# Patient Record
Sex: Male | Born: 1990 | Race: White | Hispanic: No | Marital: Single | Smoking: Current every day smoker
Health system: Southern US, Community
[De-identification: ages and names within clinical notes are randomized; demographics above are authoritative.]

## PROBLEM LIST (undated history)

## (undated) HISTORY — PX: PILONIDAL CYST EXCISION: SHX744

---

## 2012-10-05 ENCOUNTER — Emergency Department (HOSPITAL_COMMUNITY)
Admission: EM | Admit: 2012-10-05 | Discharge: 2012-10-05 | Disposition: A | Discharge status: Home / Self Care | Payer: BC Managed Care – PPO | Attending: Emergency Medicine | Admitting: Emergency Medicine

## 2012-10-05 ENCOUNTER — Encounter (HOSPITAL_COMMUNITY): Payer: Self-pay | Admitting: Emergency Medicine

## 2012-10-05 ENCOUNTER — Emergency Department (HOSPITAL_COMMUNITY): Payer: BC Managed Care – PPO

## 2012-10-05 DIAGNOSIS — S2249XA Multiple fractures of ribs, unspecified side, initial encounter for closed fracture: Secondary | ICD-10-CM | POA: Insufficient documentation

## 2012-10-05 DIAGNOSIS — W1809XA Striking against other object with subsequent fall, initial encounter: Secondary | ICD-10-CM | POA: Insufficient documentation

## 2012-10-05 DIAGNOSIS — Y9351 Activity, roller skating (inline) and skateboarding: Secondary | ICD-10-CM | POA: Insufficient documentation

## 2012-10-05 DIAGNOSIS — Y9289 Other specified places as the place of occurrence of the external cause: Secondary | ICD-10-CM | POA: Insufficient documentation

## 2012-10-05 DIAGNOSIS — S2239XA Fracture of one rib, unspecified side, initial encounter for closed fracture: Secondary | ICD-10-CM

## 2012-10-05 DIAGNOSIS — F17200 Nicotine dependence, unspecified, uncomplicated: Secondary | ICD-10-CM | POA: Insufficient documentation

## 2012-10-05 MED ORDER — IBUPROFEN 400 MG PO TABS
800.0000 mg | ORAL_TABLET | Freq: Once | ORAL | Status: AC
  Administered 2012-10-05: 800 mg via ORAL
  Filled 2012-10-05: qty 2

## 2012-10-05 MED ORDER — HYDROCODONE-ACETAMINOPHEN 5-325 MG PO TABS: 1.0000 | ORAL_TABLET | Freq: Four times a day (QID) | ORAL | Status: DC | PRN

## 2012-10-05 MED ORDER — OXYCODONE-ACETAMINOPHEN 5-325 MG PO TABS
2.0000 | ORAL_TABLET | Freq: Once | ORAL | Status: AC
  Administered 2012-10-05: 2 via ORAL
  Filled 2012-10-05: qty 2

## 2012-10-05 MED ORDER — IBUPROFEN 800 MG PO TABS: 800.0000 mg | ORAL_TABLET | Freq: Three times a day (TID) | ORAL | Status: DC

## 2012-10-05 NOTE — ED Notes (Signed)
Pt. Stated, i fell backwards and hit a corner on my rt. Rib area and when i woke up this morning I couldn't hardly move.
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
## 2014-10-19 ENCOUNTER — Ambulatory Visit (INDEPENDENT_AMBULATORY_CARE_PROVIDER_SITE_OTHER): Payer: 59 | Admitting: Family Medicine

## 2014-10-19 VITALS — BP 118/72 | HR 77 | Temp 98.4°F | Resp 18 | Ht 68.0 in | Wt 163.8 lb

## 2014-10-19 DIAGNOSIS — R112 Nausea with vomiting, unspecified: Secondary | ICD-10-CM

## 2014-10-19 DIAGNOSIS — R197 Diarrhea, unspecified: Secondary | ICD-10-CM

## 2014-10-19 DIAGNOSIS — K5289 Other specified noninfective gastroenteritis and colitis: Secondary | ICD-10-CM

## 2014-10-19 LAB — POCT CBC
Granulocyte percent: 68.9 %G (ref 37–80)
HCT, POC: 49.4 % (ref 43.5–53.7)
Hemoglobin: 16.3 g/dL (ref 14.1–18.1)
Lymph, poc: 1.6 (ref 0.6–3.4)
MCH, POC: 30 pg (ref 27–31.2)
MCHC: 33 g/dL (ref 31.8–35.4)
MCV: 90.9 fL (ref 80–97)
MID (cbc): 0.5 (ref 0–0.9)
MPV: 8.7 fL (ref 0–99.8)
POC Granulocyte: 4.7 (ref 2–6.9)
POC LYMPH PERCENT: 24 %L (ref 10–50)
POC MID %: 7.1 %M (ref 0–12)
Platelet Count, POC: 222 10*3/uL (ref 142–424)
RBC: 5.44 M/uL (ref 4.69–6.13)
RDW, POC: 13 %
WBC: 6.8 10*3/uL (ref 4.6–10.2)

## 2014-10-19 MED ORDER — PROMETHAZINE HCL 25 MG PO TABS: 25.0000 mg | ORAL_TABLET | Freq: Three times a day (TID) | ORAL | Status: DC | PRN

## 2014-10-19 NOTE — Progress Notes (Signed)
Chief Complaint:  Chief Complaint  Patient presents with  . Diarrhea    x2 days; possible food poisoning  . Emesis  . Dizziness  . Abdominal Pain    HPI: Miguel Moody is a 23 y.o. male who is here for  Food posioning, went to [employer] on Friday and [employer] on Saturday Ate falafel at both places, they had sour cream sauce at [employer] and mediterranenan sauce at [employer] who ate with him at [employer] is fine, he did have same sauces .  5 pm on Sunday was when sxs started, no fevers or chills, he is minimally better nonbloody diarrhea, he has been laying in bed since Sunday. Has used bathroom  Has had abd pain and emesis with just stomach acid He has strong cramps and pressure.  He is not on any new meds, no abx and no new travels, no fevers or chills, no sick contacts No family hx of crohns colitis or IBD or IBS  History reviewed. No pertinent past medical history. History reviewed. No pertinent past surgical history. History   Social History  . Marital Status: Single    Spouse Name: N/A    Number of Children: N/A  . Years of Education: N/A   Social History Main Topics  . Smoking status: Current Every Day Smoker  . Smokeless tobacco: None  . Alcohol Use: Yes  . Drug Use: No  . Sexual Activity: None   Other Topics Concern  . None   Social History Narrative   History reviewed. No pertinent family history. No Known Allergies Prior to Admission medications   Not on File     ROS: The patient denies fevers, chills, night sweats, unintentional weight loss, chest pain, palpitations, wheezing, dyspnea on exertion,, dysuria, hematuria, melena, numbness, weakness, or tingling.   All other systems have been reviewed and were otherwise negative with the exception of those mentioned in the HPI and as above.    PHYSICAL EXAM: Filed Vitals:   10/19/14 1819  BP: 118/72  Pulse: 77  Temp: 98.4 F (36.9 C)  Resp: 18   Filed Vitals:   10/19/14 1819  Height: 5\' 8"  (1.727 m)  Weight: 163 lb 12.8 oz (74.299 kg)   Body mass index is 24.91 kg/(m^2).  General: Alert, no acute distress HEENT:  Normocephalic, atraumatic, oropharynx patent. EOMI, PERRLA Cardiovascular:  Regular rate and rhythm, no rubs murmurs or gallops.  No Carotid bruits, radial pulse intact. No pedal edema.  Respiratory: Clear to auscultation bilaterally.  No wheezes, rales, or rhonchi.  No cyanosis, no use of accessory musculature GI: No organomegaly, abdomen is soft and non-tender, positive bowel sounds.  No masses. Skin: No rashes. Neurologic: Facial musculature symmetric. Psychiatric: Patient is appropriate throughout our interaction. Lymphatic: No cervical lymphadenopathy Musculoskeletal: Gait intact.   LABS: Results for orders placed or performed in visit on 10/19/14  Ova and parasite examination  Result Value Ref Range   OP No Ova or Parasites Seen    COMPLETE METABOLIC PANEL WITH GFR  Result Value Ref Range   Sodium 139 135 - 145 mEq/L   Potassium 4.3 3.5 - 5.3 mEq/L   Chloride 104 96 - 112 mEq/L   CO2 27 19 - 32 mEq/L   Glucose, Bld 85 70 - 99 mg/dL   BUN 5 (L) 6 - 23 mg/dL   Creat 0.74 0.50 - 1.35 mg/dL   Total Bilirubin 0.6 0.2 - 1.2 mg/dL   Alkaline Phosphatase  52 39 - 117 U/L   AST 22 0 - 37 U/L   ALT 29 0 - 53 U/L   Total Protein 7.0 6.0 - 8.3 g/dL   Albumin 4.3 3.5 - 5.2 g/dL   Calcium 9.7 8.4 - 10.5 mg/dL   GFR, Est African American >89 mL/min   GFR, Est Non African American >89 mL/min  POCT CBC  Result Value Ref Range   WBC 6.8 4.6 - 10.2 K/uL   Lymph, poc 1.6 0.6 - 3.4   POC LYMPH PERCENT 24.0 10 - 50 %L   MID (cbc) 0.5 0 - 0.9   POC MID % 7.1 0 - 12 %M   POC Granulocyte 4.7 2 - 6.9   Granulocyte percent 68.9 37 - 80 %G   RBC 5.44 4.69 - 6.13 M/uL   Hemoglobin 16.3 14.1 - 18.1 g/dL   HCT, POC 49.4 43.5 - 53.7 %   MCV 90.9 80 - 97 fL   MCH, POC 30.0 27 - 31.2 pg   MCHC 33.0 31.8 - 35.4 g/dL   RDW, POC 13.0 %    Platelet Count, POC 222 142 - 424 K/uL   MPV 8.7 0 - 99.8 fL     EKG/XRAY:   Primary read interpreted by Dr. Marin Comment at [facility].   ASSESSMENT/PLAN: Encounter Diagnoses  Name Primary?  . Nausea and vomiting, vomiting of unspecified type Yes  . Diarrhea   . Other noninfectious gastroenteritis    Rx promethazine, BRAT, push fluids Stool cx and OP pending Will turn in stool sample when he is able to do it. Only able to give small specimen today F/u prn   Gross sideeffects, risk and benefits, and alternatives of medications d/w patient. Patient is aware that all medications have potential sideeffects and we are unable to predict every sideeffect or drug-drug interaction that may occur.  Shahiem Bedwell, [place], DO 10/23/2014 11:47 AM

## 2014-10-19 NOTE — Patient Instructions (Addendum)
Food Choices to Help Relieve Diarrhea When you have diarrhea, the foods you eat and your eating habits are very important. Choosing the right foods and drinks can help relieve diarrhea. Also, because diarrhea can last up to 7 days, you need to replace lost fluids and electrolytes (such as sodium, potassium, and chloride) in order to help prevent dehydration.  WHAT GENERAL GUIDELINES DO I NEED TO FOLLOW?  Slowly drink 1 cup (8 oz) of fluid for each episode of diarrhea. If you are getting enough fluid, your urine will be clear or pale yellow.  Eat starchy foods. Some good choices include white rice, white toast, pasta, low-fiber cereal, baked potatoes (without the skin), saltine crackers, and bagels.  Avoid large servings of any cooked vegetables.  Limit fruit to two servings per day. A serving is  cup or 1 small piece.  Choose foods with less than 2 g of fiber per serving.  Limit fats to less than 8 tsp (38 g) per day.  Avoid fried foods.  Eat foods that have probiotics in them. Probiotics can be found in certain dairy products.  Avoid foods and beverages that may increase the speed at which food moves through the stomach and intestines (gastrointestinal tract). Things to avoid include:  High-fiber foods, such as dried fruit, raw fruits and vegetables, nuts, seeds, and whole grain foods.  Spicy foods and high-fat foods.  Foods and beverages sweetened with high-fructose corn syrup, honey, or sugar alcohols such as xylitol, sorbitol, and mannitol. WHAT FOODS ARE RECOMMENDED? Grains White rice. White, French, or pita breads (fresh or toasted), including plain rolls, buns, or bagels. White pasta. Saltine, soda, or graham crackers. Pretzels. Low-fiber cereal. Cooked cereals made with water (such as cornmeal, farina, or cream cereals). Plain muffins. Matzo. Melba toast. Zwieback.  Vegetables Potatoes (without the skin). Strained tomato and vegetable juices. Most well-cooked and canned  vegetables without seeds. Tender lettuce. Fruits Cooked or canned applesauce, apricots, cherries, fruit cocktail, grapefruit, peaches, pears, or plums. Fresh bananas, apples without skin, cherries, grapes, cantaloupe, grapefruit, peaches, oranges, or plums.  Meat and Other Protein Products Baked or boiled chicken. Eggs. Tofu. Fish. Seafood. Smooth peanut butter. Ground or well-cooked tender beef, ham, veal, lamb, pork, or poultry.  Dairy Plain yogurt, kefir, and unsweetened liquid yogurt. Lactose-free milk, buttermilk, or soy milk. Plain hard cheese. Beverages Sport drinks. Clear broths. Diluted fruit juices (except prune). Regular, caffeine-free sodas such as ginger ale. Water. Decaffeinated teas. Oral rehydration solutions. Sugar-free beverages not sweetened with sugar alcohols. Other Bouillon, broth, or soups made from recommended foods.  The items listed above may not be a complete list of recommended foods or beverages. Contact your dietitian for more options. WHAT FOODS ARE NOT RECOMMENDED? Grains Whole grain, whole wheat, bran, or rye breads, rolls, pastas, crackers, and cereals. Wild or brown rice. Cereals that contain more than 2 g of fiber per serving. Corn tortillas or taco shells. Cooked or dry oatmeal. Granola. Popcorn. Vegetables Raw vegetables. Cabbage, broccoli, Brussels sprouts, artichokes, baked beans, beet greens, corn, kale, legumes, peas, sweet potatoes, and yams. Potato skins. Cooked spinach and cabbage. Fruits Dried fruit, including raisins and dates. Raw fruits. Stewed or dried prunes. Fresh apples with skin, apricots, mangoes, pears, raspberries, and strawberries.  Meat and Other Protein Products Chunky peanut butter. Nuts and seeds. Beans and lentils. Bacon.  Dairy High-fat cheeses. Milk, chocolate milk, and beverages made with milk, such as milk shakes. Cream. Ice cream. Sweets and Desserts Sweet rolls, doughnuts, and sweet breads. Pancakes   and waffles. Fats and  Oils Butter. Cream sauces. Margarine. Salad oils. Plain salad dressings. Olives. Avocados.  Beverages Caffeinated beverages (such as coffee, tea, soda, or energy drinks). Alcoholic beverages. Fruit juices with pulp. Prune juice. Soft drinks sweetened with high-fructose corn syrup or sugar alcohols. Other Coconut. Hot sauce. Chili powder. Mayonnaise. Gravy. Cream-based or milk-based soups.  The items listed above may not be a complete list of foods and beverages to avoid. Contact your dietitian for more information. WHAT SHOULD I DO IF I BECOME DEHYDRATED? Diarrhea can sometimes lead to dehydration. Signs of dehydration include dark urine and dry mouth and skin. If you think you are dehydrated, you should rehydrate with an oral rehydration solution. These solutions can be purchased at pharmacies, retail stores, or online.  Drink -1 cup (120-240 mL) of oral rehydration solution each time you have an episode of diarrhea. If drinking this amount makes your diarrhea worse, try drinking smaller amounts more often. For example, drink 1-3 tsp (5-15 mL) every 5-10 minutes.  A general rule for staying hydrated is to drink 1-2 L of fluid per day. Talk to your health care provider about the specific amount you should be drinking each day. Drink enough fluids to keep your urine clear or pale yellow. Document Released: 01/05/2004 Document Revised: 10/20/2013 Document Reviewed: 09/07/2013 [facility] Patient Information 2015 [place], [place]. This information is not intended to replace advice given to you by your health care provider. Make sure you discuss any questions you have with your health care provider.   Promethazine tablets What is this medicine? PROMETHAZINE (proe METH a zeen) is an antihistamine. It is used to treat allergic reactions and to treat or prevent nausea and vomiting from illness or motion sickness. It is also used to make you sleep before surgery, and to help treat pain or nausea after  surgery. This medicine may be used for other purposes; ask your health care provider or pharmacist if you have questions. COMMON BRAND NAME(S): Phenergan What should I tell my health care provider before I take this medicine? They need to know if you have any of these conditions: -glaucoma -high blood pressure or heart disease -kidney disease -liver disease -lung or breathing disease, like asthma -prostate trouble -pain or difficulty passing urine -seizures -an unusual or allergic reaction to promethazine or phenothiazines, other medicines, foods, dyes, or preservatives -pregnant or trying to get pregnant -breast-feeding How should I use this medicine? Take this medicine by mouth with a glass of water. Follow the directions on the prescription label. Take your doses at regular intervals. Do not take your medicine more often than directed. Talk to your pediatrician regarding the use of this medicine in children. Special care may be needed. This medicine should not be given to infants and children younger than 92 years old. Overdosage: If you think you have taken too much of this medicine contact a poison control center or emergency room at once. NOTE: This medicine is only for you. Do not share this medicine with others. What if I miss a dose? If you miss a dose, take it as soon as you can. If it is almost time for your next dose, take only that dose. Do not take double or extra doses. What may interact with this medicine? Do not take this medicine with any of the following medications: -cisapride -dofetilide -dronedarone -MAOIs like Carbex, Eldepryl, Marplan, Nardil, Parnate -pimozide -quinidine, including dextromethorphan; quinidine -thioridazine -ziprasidone This medicine may also interact with the following medications: -certain medicines  for depression, anxiety, or psychotic disturbances -certain medicines for anxiety or sleep -certain medicines for seizures like carbamazepine,  phenobarbital, phenytoin -certain medicines for movement abnormalities as in Parkinson's disease, or for gastrointestinal problems -epinephrine -medicines for allergies or colds -muscle relaxants -narcotic medicines for pain -other medicines that prolong the QT interval (cause an abnormal heart rhythm) -tramadol -trimethobenzamide This list may not describe all possible interactions. Give your health care provider a list of all the medicines, herbs, non-prescription drugs, or dietary supplements you use. Also tell them if you smoke, drink alcohol, or use illegal drugs. Some items may interact with your medicine. What should I watch for while using this medicine? Tell your doctor or health care professional if your symptoms do not start to get better in 1 to 2 days. You may get drowsy or dizzy. Do not drive, use machinery, or do anything that needs mental alertness until you know how this medicine affects you. To reduce the risk of dizzy or fainting spells, do not stand or sit up quickly, especially if you are an older patient. Alcohol may increase dizziness and drowsiness. Avoid alcoholic drinks. Your mouth may get dry. Chewing sugarless gum or sucking hard candy, and drinking plenty of water may help. Contact your doctor if the problem does not go away or is severe. This medicine may cause dry eyes and blurred vision. If you wear contact lenses you may feel some discomfort. Lubricating drops may help. See your eye doctor if the problem does not go away or is severe. This medicine can make you more sensitive to the sun. Keep out of the sun. If you cannot avoid being in the sun, wear protective clothing and use sunscreen. Do not use sun lamps or tanning beds/booths. If you are diabetic, check your blood-sugar levels regularly. What side effects may I notice from receiving this medicine? Side effects that you should report to your doctor or health care professional as soon as possible: -blurred  vision -irregular heartbeat, palpitations or chest pain -muscle or facial twitches -pain or difficulty passing urine -seizures -skin rash -slowed or shallow breathing -unusual bleeding or bruising -yellowing of the eyes or skin Side effects that usually do not require medical attention (report to your doctor or health care professional if they continue or are bothersome): -headache -nightmares, agitation, nervousness, excitability, not able to sleep (these are more likely in children) -stuffy nose This list may not describe all possible side effects. Call your doctor for medical advice about side effects. You may report side effects to FDA at 1-800-FDA-1088. Where should I keep my medicine? Keep out of the reach of children. Store at room temperature, between 20 and 25 degrees C (68 and 77 degrees F). Protect from light. Throw away any unused medicine after the expiration date. NOTE: This sheet is a summary. It may not cover all possible information. If you have questions about this medicine, talk to your doctor, pharmacist, or health care provider.  2015, [employer]. (2013-06-16 15:04:46)

## 2014-10-20 LAB — COMPLETE METABOLIC PANEL WITH GFR
ALT: 29 U/L (ref 0–53)
Albumin: 4.3 g/dL (ref 3.5–5.2)
BUN: 5 mg/dL — ABNORMAL LOW (ref 6–23)
CO2: 27 mEq/L (ref 19–32)
Calcium: 9.7 mg/dL (ref 8.4–10.5)
Chloride: 104 mEq/L (ref 96–112)
Creat: 0.74 mg/dL (ref 0.50–1.35)
GFR, Est African American: >89 mL/min

## 2014-10-20 LAB — COMPLETE METABOLIC PANEL WITHOUT GFR
AST: 22 U/L (ref 0–37)
Alkaline Phosphatase: 52 U/L (ref 39–117)
GFR, Est Non African American: >89 mL/min
Glucose, Bld: 85 mg/dL (ref 70–99)
Potassium: 4.3 meq/L (ref 3.5–5.3)
Sodium: 139 meq/L (ref 135–145)
Total Bilirubin: 0.6 mg/dL (ref 0.2–1.2)
Total Protein: 7 g/dL (ref 6.0–8.3)

## 2014-10-21 LAB — OVA AND PARASITE EXAMINATION: OP: NONE SEEN

## 2014-11-16 ENCOUNTER — Telehealth: Payer: Self-pay

## 2014-11-16 NOTE — Telephone Encounter (Signed)
Pt states he received a letter to call regarding his labs, please call [phone] and leave message if he doesn't answer

## 2014-11-17 NOTE — Telephone Encounter (Signed)
Pt notified of labs

## 2016-09-25 ENCOUNTER — Encounter (HOSPITAL_COMMUNITY): Payer: Self-pay | Admitting: *Deleted

## 2016-09-25 ENCOUNTER — Ambulatory Visit (HOSPITAL_COMMUNITY)
Admission: EM | Admit: 2016-09-25 | Discharge: 2016-09-25 | Disposition: A | Discharge status: Home / Self Care | Payer: Self-pay | Attending: Emergency Medicine | Admitting: Emergency Medicine

## 2016-09-25 DIAGNOSIS — L089 Local infection of the skin and subcutaneous tissue, unspecified: Secondary | ICD-10-CM

## 2016-09-25 DIAGNOSIS — L729 Follicular cyst of the skin and subcutaneous tissue, unspecified: Secondary | ICD-10-CM

## 2016-09-25 MED ORDER — DOXYCYCLINE HYCLATE 100 MG PO CAPS: 100.0000 mg | ORAL_CAPSULE | Freq: Two times a day (BID) | ORAL | 0 refills | Status: DC

## 2016-09-25 NOTE — Discharge Instructions (Signed)
You may shower and cleansed the wound at least once a day. Apply warm water compresses to the wound 2-3 times a day. After the wound has been wet be sure to press a clean cloth or other material over the wound to wake any dirty water or blood from the wound. The incision should heal up in the next 24-48 hours. Take the antibiotic as directed. If in the meantime it is getting worse, he developed redness, red streaks, fever or other signs of worsening infection return promptly.

## 2016-09-25 NOTE — ED Notes (Signed)
Patient's wound covered with nonadherent gauze and hyperflex tape.

## 2016-09-25 NOTE — ED Provider Notes (Signed)
CSN: [id number]     Arrival date & time 09/25/16  1754 History   First MD Initiated Contact with Patient 09/25/16 1913     Chief Complaint  Patient presents with  . Lymphadenopathy   (Consider location/radiation/quality/duration/timing/severity/associated sxs/prior Treatment) 25 year old male complaining of a swelling in the left axilla for a month. States is been getting worse in the past few days, positive for tenderness. There is also a small cord like tender structure there can be palpated from the left axilla towards the elbow along the medial aspect of the left upper arm. Not well observed. No discoloration or erythema.      History reviewed. No pertinent past medical history. Past Surgical History:  Procedure Laterality Date  . PILONIDAL CYST EXCISION     History reviewed. No pertinent family history. Social History  Substance Use Topics  . Smoking status: Current Every Day Smoker  . Smokeless tobacco: Not on file  . Alcohol use No    Review of Systems  Constitutional: Negative.  Negative for fatigue and fever.  HENT: Negative.   Respiratory: Negative.   Gastrointestinal: Negative.   Skin: Negative for color change, rash and wound.  Neurological: Negative.     Allergies  Patient has no known allergies.  Home Medications   Prior to Admission medications   Medication Sig Start Date End Date Taking? Authorizing Provider  doxycycline (VIBRAMYCIN) 100 MG capsule Take 1 capsule (100 mg total) by mouth 2 (two) times daily. 09/25/16   Janne Napoleon, NP   Meds Ordered and Administered this Visit  Medications - No data to display  BP 122/65 (BP Location: Right Arm)   Pulse 65   Temp 98.2 F (36.8 C) (Oral)   Resp 18   SpO2 99%  No data found.   Physical Exam  Constitutional: He is oriented to person, place, and time. He appears well-developed.  Somewhat disheveled and unkept appearance. No signs of distress.  HENT:  Head: Normocephalic and atraumatic.  Eyes:  EOM are normal.  Neck: Neck supple.  Cardiovascular: Normal rate.   Pulmonary/Chest: Effort normal.  Musculoskeletal: Normal range of motion.  Neurological: He is alert and oriented to person, place, and time.  Skin: Skin is warm and dry.  Nursing note and vitals reviewed.   Urgent Care Course   Clinical Course     .Marland KitchenIncision and Drainage Date/Time: 09/25/2016 8:00 PM Performed by: Marcha Dutton, Dennise Raabe Authorized by: Melony Overly   Consent:    Consent obtained:  Verbal   Consent given by:  Patient   Risks discussed:  Bleeding, pain and infection   Alternatives discussed:  Alternative treatment Location:    Type:  Cyst   Size:  3.5 cm   Location:  Upper extremity   Upper extremity location:  Arm   Arm location:  L upper arm Pre-procedure details:    Skin preparation:  Betadine Anesthesia (see MAR for exact dosages):    Anesthesia method:  Local infiltration   Local anesthetic:  Lidocaine 2% w/o epi Procedure type:    Complexity:  Simple Procedure details:    Needle aspiration: no     Incision types:  Single straight   Incision depth:  Dermal   Scalpel blade:  11   Wound management:  Debrided   Drainage:  Purulent and bloody   Drainage amount:  Moderate   Wound treatment:  Wound left open   Packing materials:  None Post-procedure details:    Patient tolerance of procedure:  Tolerated well, no  immediate complications Comments:     Superficial. Relatively thin wall with contents drained and manually expressed. Thick 4 x 4 dressing.   (including critical care time)  Labs Review Labs Reviewed - No data to display  Imaging Review No results found.   Visual Acuity Review  Right Eye Distance:   Left Eye Distance:   Bilateral Distance:    Right Eye Near:   Left Eye Near:    Bilateral Near:         MDM   1. Infected cyst of skin    You may shower and cleansed the wound at least once a day. Apply warm water compresses to the wound 2-3 times a day. After  the wound has been wet be sure to press a clean cloth or other material over the wound to wake any dirty water or blood from the wound. The incision should heal up in the next 24-48 hours. Take the antibiotic as directed. If in the meantime it is getting worse, he developed redness, red streaks, fever or other signs of worsening infection return promptly. Meds ordered this encounter  Medications  . doxycycline (VIBRAMYCIN) 100 MG capsule    Sig: Take 1 capsule (100 mg total) by mouth 2 (two) times daily.    Dispense:  20 capsule    Refill:  0    Order Specific Question:   Supervising Provider    Answer:   Melony Overly [id number]      Janne Napoleon, NP 09/25/16 2013    Janne Napoleon, NP 09/25/16 2019

## 2016-09-25 NOTE — ED Triage Notes (Signed)
Pt    Reports    Swelling  l  [place]      X  1  Month   Worse   Over  Last       Week        Pain  Is  Worse   On  Certain movements

## 2018-02-13 ENCOUNTER — Ambulatory Visit (HOSPITAL_COMMUNITY)
Admission: EM | Admit: 2018-02-13 | Discharge: 2018-02-13 | Disposition: A | Discharge status: Home / Self Care | Payer: Self-pay | Attending: Family Medicine | Admitting: Family Medicine

## 2018-02-13 ENCOUNTER — Encounter (HOSPITAL_COMMUNITY): Payer: Self-pay | Admitting: Family Medicine

## 2018-02-13 DIAGNOSIS — L0291 Cutaneous abscess, unspecified: Secondary | ICD-10-CM | POA: Diagnosis present

## 2018-02-13 MED ORDER — CEPHALEXIN 500 MG PO CAPS: 500.0000 mg | ORAL_CAPSULE | Freq: Two times a day (BID) | ORAL | 0 refills | Status: DC

## 2018-02-13 MED ORDER — LIDOCAINE HCL (PF) 1 % IJ SOLN
INTRAMUSCULAR | Status: AC
  Filled 2018-02-13: qty 2

## 2018-02-13 MED ORDER — HYDROCODONE-ACETAMINOPHEN 5-325 MG PO TABS: 2.0000 | ORAL_TABLET | ORAL | 0 refills | Status: DC | PRN

## 2018-02-13 NOTE — ED Provider Notes (Signed)
[place]    CSN: [id number] Arrival date & time: 02/13/18  1301     History   Chief Complaint Chief Complaint  Patient presents with  . Abscess    HPI Miguel Moody is a 27 y.o. male.   Patient has abscess on back of neck.  There is pain but no drainage.  HPI  History reviewed. No pertinent past medical history.  Patient Active Problem List   Diagnosis Date Noted  . Abscess 02/13/2018    Past Surgical History:  Procedure Laterality Date  . PILONIDAL CYST EXCISION         Home Medications    Prior to Admission medications   Medication Sig Start Date End Date Taking? Authorizing Provider  cephALEXin (KEFLEX) 500 MG capsule Take 1 capsule (500 mg total) by mouth 2 (two) times daily. 02/13/18   Wardell Honour, MD  HYDROcodone-acetaminophen (NORCO/VICODIN) 5-325 MG tablet Take 2 tablets by mouth every 4 (four) hours as needed. 02/13/18   Wardell Honour, MD    Family History History reviewed. No pertinent family history.  Social History Social History   Tobacco Use  . Smoking status: Current Every Day Smoker  Substance Use Topics  . Alcohol use: No  . Drug use: No     Allergies   Patient has no known allergies.   Review of Systems Review of Systems  Constitutional: Negative for chills and fever.  HENT: Negative for ear pain and sore throat.   Eyes: Negative for pain and visual disturbance.  Respiratory: Negative for cough and shortness of breath.   Cardiovascular: Negative for chest pain and palpitations.  Gastrointestinal: Negative for abdominal pain and vomiting.  Genitourinary: Negative for dysuria and hematuria.  Musculoskeletal: Positive for neck pain. Negative for arthralgias and back pain.  Skin: Positive for wound. Negative for color change and rash.  Neurological: Negative for seizures and syncope.  All other systems reviewed and are negative.    Physical Exam Triage Vital Signs ED Triage Vitals  Enc Vitals Group       BP 02/13/18 1343 (!) 108/57     Pulse Rate 02/13/18 1343 63     Resp 02/13/18 1343 18     Temp 02/13/18 1343 98.2 F (36.8 C)     Temp Source 02/13/18 1343 Oral     SpO2 02/13/18 1343 100 %     Weight --      Height --      Head Circumference --      Peak Flow --      Pain Score 02/13/18 1342 7     Pain Loc --      Pain Edu? --      Excl. in [place]? --    No data found.  Updated Vital Signs BP (!) 108/57   Pulse 63   Temp 98.2 F (36.8 C) (Oral)   Resp 18   SpO2 100%   Visual Acuity Right Eye Distance:   Left Eye Distance:   Bilateral Distance:    Right Eye Near:   Left Eye Near:    Bilateral Near:     Physical Exam  Constitutional: He appears well-developed and well-nourished.  Skin:  Skin of her abscess was prepped with Betadine and anesthetized with 1% Xylocaine stab wound was made with a #15 scalpel.  Small amount of pus was drained.  Attempt was made to enlarge the wound still without much drainage.  Small iodoform gauze was inserted to act as a  wick to facilitate further drainage.  Simple dressing was applied     UC Treatments / Results  Labs (all labs ordered are listed, but only abnormal results are displayed) Labs Reviewed - No data to display  EKG None Radiology No results found.  Procedures Procedures (including critical care time)  Medications Ordered in UC Medications - No data to display   Initial Impression / Assessment and Plan / UC Course  I have reviewed the triage vital signs and the nursing notes.  Pertinent labs & imaging results that were available during my care of the patient were reviewed by me and considered in my medical decision making (see chart for details).     Skin abscess, I&D  Final Clinical Impressions(s) / UC Diagnoses   Final diagnoses:  Abscess    ED Discharge Orders        Ordered    cephALEXin (KEFLEX) 500 MG capsule  2 times daily     02/13/18 1408    HYDROcodone-acetaminophen (NORCO/VICODIN) 5-325  MG tablet  Every 4 hours PRN     02/13/18 1408       Controlled Substance Prescriptions [place] Controlled Substance Registry consulted? No   Wardell Honour, MD 02/13/18 1410

## 2018-02-13 NOTE — ED Triage Notes (Signed)
Pt here for abscess to neck. Denies drainage. Abscess very large and erythematous.

## 2018-09-30 ENCOUNTER — Ambulatory Visit (HOSPITAL_COMMUNITY)
Admission: EM | Admit: 2018-09-30 | Discharge: 2018-09-30 | Disposition: A | Discharge status: Home / Self Care | Payer: Self-pay | Attending: Family Medicine | Admitting: Family Medicine

## 2018-09-30 ENCOUNTER — Other Ambulatory Visit: Payer: Self-pay

## 2018-09-30 ENCOUNTER — Encounter (HOSPITAL_COMMUNITY): Payer: Self-pay | Admitting: Emergency Medicine

## 2018-09-30 DIAGNOSIS — L02412 Cutaneous abscess of left axilla: Secondary | ICD-10-CM

## 2018-09-30 MED ORDER — BACITRACIN ZINC 500 UNIT/GM EX OINT
TOPICAL_OINTMENT | CUTANEOUS | Status: AC
  Filled 2018-09-30: qty 0.9

## 2018-09-30 MED ORDER — CEPHALEXIN 500 MG PO CAPS: 500.0000 mg | ORAL_CAPSULE | Freq: Four times a day (QID) | ORAL | 0 refills | Status: AC

## 2018-09-30 MED ORDER — LIDOCAINE-EPINEPHRINE (PF) 2 %-1:200000 IJ SOLN
INTRAMUSCULAR | Status: AC
  Filled 2018-09-30: qty 10

## 2018-09-30 NOTE — ED Provider Notes (Signed)
[place]    CSN: [id number] Arrival date & time: 09/30/18  1658     History   Chief Complaint Chief Complaint  Patient presents with  . Abscess    HPI Miguel Moody is a 27 y.o. male.   27 year old male comes in for 1 week history of abscess to the left axilla.  Has a history of abscesses in the past, no obvious precipitating factor.  Has had increase in swelling, erythema, warmth despite warm compresses.  Denies fever, chills, night sweats.  Denies injury to the area where shaving.     History reviewed. No pertinent past medical history.  Patient Active Problem List   Diagnosis Date Noted  . Abscess 02/13/2018    Past Surgical History:  Procedure Laterality Date  . PILONIDAL CYST EXCISION         Home Medications    Prior to Admission medications   Medication Sig Start Date End Date Taking? Authorizing Provider  cephALEXin (KEFLEX) 500 MG capsule Take 1 capsule (500 mg total) by mouth 4 (four) times daily. 09/30/18   Ok Edwards, PA-C    Family History No family history on file.  Social History Social History   Tobacco Use  . Smoking status: Current Every Day Smoker  Substance Use Topics  . Alcohol use: No  . Drug use: No     Allergies   Patient has no known allergies.   Review of Systems Review of Systems  Reason unable to perform ROS: See HPI as above.     Physical Exam Triage Vital Signs ED Triage Vitals  Enc Vitals Group     BP 09/30/18 1800 122/68     Pulse Rate 09/30/18 1800 75     Resp 09/30/18 1800 18     Temp 09/30/18 1800 98 F (36.7 C)     Temp Source 09/30/18 1800 Oral     SpO2 09/30/18 1800 100 %     Weight --      Height --      Head Circumference --      Peak Flow --      Pain Score 09/30/18 1759 7     Pain Loc --      Pain Edu? --      Excl. in [place]? --    No data found.  Updated Vital Signs BP 122/68 (BP Location: Right Arm)   Pulse 75   Temp 98 F (36.7 C) (Oral)   Resp 18   SpO2 100%    Physical Exam  Constitutional: He is oriented to person, place, and time. He appears well-developed and well-nourished. No distress.  HENT:  Head: Normocephalic and atraumatic.  Eyes: Pupils are equal, round, and reactive to light. Conjunctivae are normal.  Neurological: He is alert and oriented to person, place, and time.  Skin: He is not diaphoretic.  1cm x 4cm abscess with surrounding cellulitis about 2cm x 9cm.      UC Treatments / Results  Labs (all labs ordered are listed, but only abnormal results are displayed) Labs Reviewed - No data to display  EKG None  Radiology No results found.  Procedures Incision and Drainage Date/Time: 09/30/2018 7:13 PM Performed by: Ok Edwards, PA-C Authorized by: Vanessa Kick, MD   Consent:    Consent obtained:  Verbal   Consent given by:  Patient   Risks discussed:  Bleeding, incomplete drainage, pain, damage to other organs and infection   Alternatives discussed:  Referral and alternative  treatment Location:    Type:  Abscess   Size:  1cm x 2cm   Location:  Upper extremity   Upper extremity location:  Arm   Arm location:  L upper arm Pre-procedure details:    Skin preparation:  Hibiclens Anesthesia (see MAR for exact dosages):    Anesthesia method:  Local infiltration   Local anesthetic:  Lidocaine 2% WITH epi Procedure type:    Complexity:  Simple Procedure details:    Needle aspiration: no     Incision types:  Single straight   Incision depth:  Dermal   Scalpel blade:  11   Wound management:  Probed and deloculated   Drainage:  Purulent   Drainage amount:  Copious   Wound treatment:  Wound left open   Packing materials:  None Post-procedure details:    Patient tolerance of procedure:  Tolerated well, no immediate complications   (including critical care time)  Medications Ordered in UC Medications - No data to display  Initial Impression / Assessment and Plan / UC Course  I have reviewed the triage vital  signs and the nursing notes.  Pertinent labs & imaging results that were available during my care of the patient were reviewed by me and considered in my medical decision making (see chart for details).    Patient tolerated procedure well. Start keflex for surrounding cellulitis. Wound care instructions given. Return precautions given. Patient expresses understanding and agrees to plan.   Final Clinical Impressions(s) / UC Diagnoses   Final diagnoses:  Abscess of left axilla    ED Prescriptions    Medication Sig Dispense Auth. Provider   cephALEXin (KEFLEX) 500 MG capsule Take 1 capsule (500 mg total) by mouth 4 (four) times daily. 28 capsule Tobin Chad, [place] 09/30/18 1914

## 2018-09-30 NOTE — ED Triage Notes (Signed)
Abscess to left axilla.  Noticed abscess on week ago.  History of the same

## 2018-09-30 NOTE — Discharge Instructions (Signed)
Start keflex as directed. You can remove current dressing in 24 hours. Keep wound clean and dry. You can clean gently with soap and water. Do not soak area in water. Tylenol/motrin for pain. Monitor for spreading redness, increased warmth, increased swelling, fever, follow up for reevaluation needed.

## 2019-06-06 ENCOUNTER — Other Ambulatory Visit: Payer: Self-pay | Admitting: Radiology

## 2019-06-06 DIAGNOSIS — Z20822 Contact with and (suspected) exposure to covid-19: Secondary | ICD-10-CM

## 2019-06-07 LAB — NOVEL CORONAVIRUS, NAA: SARS-CoV-2, NAA: NOT DETECTED

## 2019-06-09 ENCOUNTER — Telehealth: Payer: Self-pay | Admitting: General Practice

## 2019-06-09 NOTE — Telephone Encounter (Signed)
Pt aware covid lab test negative, not detected °

## 2019-08-31 ENCOUNTER — Other Ambulatory Visit: Payer: Self-pay

## 2019-08-31 DIAGNOSIS — Z20822 Contact with and (suspected) exposure to covid-19: Secondary | ICD-10-CM

## 2019-09-02 LAB — NOVEL CORONAVIRUS, NAA: SARS-CoV-2, NAA: NOT DETECTED

## 2019-09-07 ENCOUNTER — Other Ambulatory Visit: Payer: Self-pay

## 2019-09-07 DIAGNOSIS — Z20822 Contact with and (suspected) exposure to covid-19: Secondary | ICD-10-CM

## 2019-09-08 LAB — NOVEL CORONAVIRUS, NAA: SARS-CoV-2, NAA: NOT DETECTED

## 2019-09-14 ENCOUNTER — Other Ambulatory Visit: Payer: Self-pay | Admitting: *Deleted

## 2019-09-14 DIAGNOSIS — Z20822 Contact with and (suspected) exposure to covid-19: Secondary | ICD-10-CM

## 2019-09-16 LAB — NOVEL CORONAVIRUS, NAA: SARS-CoV-2, NAA: NOT DETECTED

## 2019-09-17 ENCOUNTER — Telehealth: Payer: Self-pay | Admitting: *Deleted

## 2019-09-17 NOTE — Telephone Encounter (Signed)
Patient called and was given NEGATIVE COVID results . 

## 2019-10-29 ENCOUNTER — Other Ambulatory Visit: Payer: Self-pay

## 2019-10-29 ENCOUNTER — Ambulatory Visit: Payer: Self-pay | Attending: Internal Medicine

## 2019-10-29 DIAGNOSIS — Z20828 Contact with and (suspected) exposure to other viral communicable diseases: Secondary | ICD-10-CM | POA: Insufficient documentation

## 2019-10-29 DIAGNOSIS — Z20822 Contact with and (suspected) exposure to covid-19: Secondary | ICD-10-CM

## 2019-10-31 LAB — NOVEL CORONAVIRUS, NAA: SARS-CoV-2, NAA: NOT DETECTED

## 2021-09-12 ENCOUNTER — Other Ambulatory Visit: Payer: Self-pay | Admitting: Family Medicine

## 2021-09-12 DIAGNOSIS — D170 Benign lipomatous neoplasm of skin and subcutaneous tissue of head, face and neck: Secondary | ICD-10-CM

## 2021-09-26 ENCOUNTER — Ambulatory Visit
Admission: RE | Admit: 2021-09-26 | Discharge: 2021-09-26 | Disposition: A | Discharge status: Home / Self Care | Payer: Self-pay | Source: Ambulatory Visit | Attending: Family Medicine | Admitting: Family Medicine

## 2021-09-26 DIAGNOSIS — D170 Benign lipomatous neoplasm of skin and subcutaneous tissue of head, face and neck: Secondary | ICD-10-CM

## 2022-11-16 IMAGING — US US SOFT TISSUE HEAD/NECK
1 series · 10 of 10 positions shown · non-contrast
Comparison: None.

CLINICAL DATA: 30-year-old male with a left submental mass

EXAM:
ULTRASOUND OF HEAD/NECK SOFT TISSUES
TECHNIQUE: Ultrasound examination of the head and neck soft tissues was
performed in the area of clinical concern.

[Series 1: us soft tissue head/neck · 0.06mm/px · 10 acquisitions, 10 frames shown]
[im 1/10]
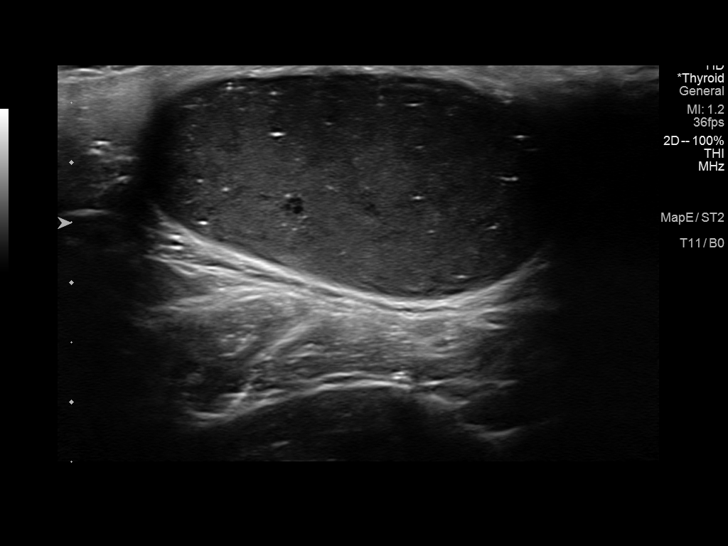
[im 2/10]
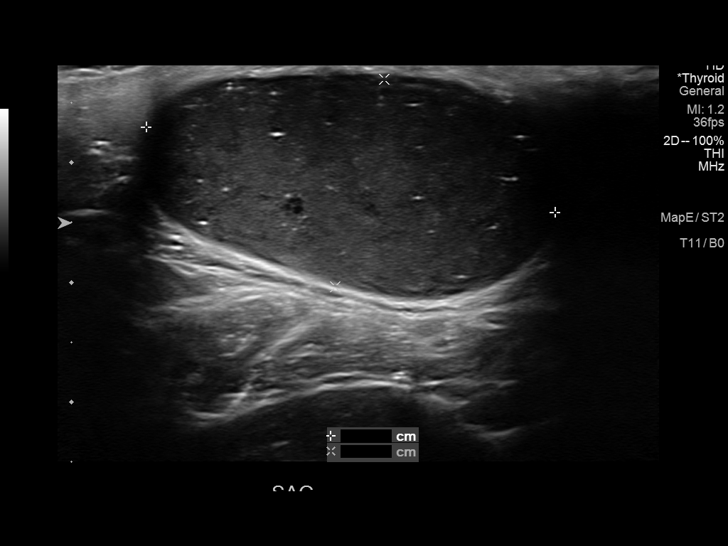
[im 3/10]
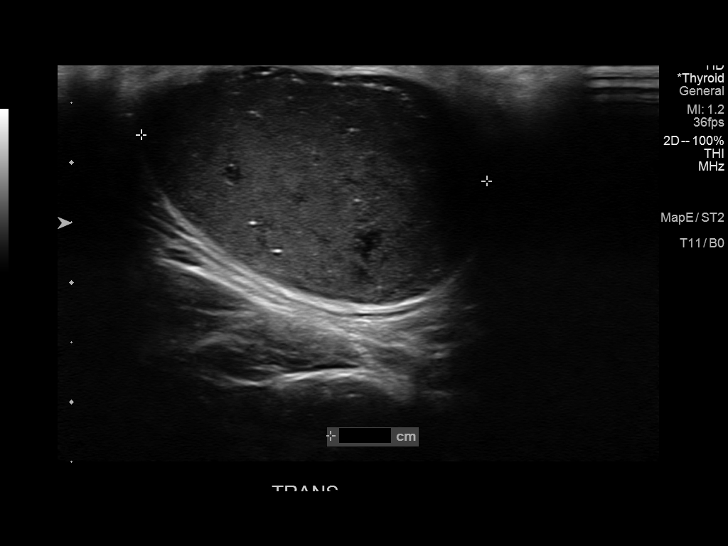
[im 4/10]
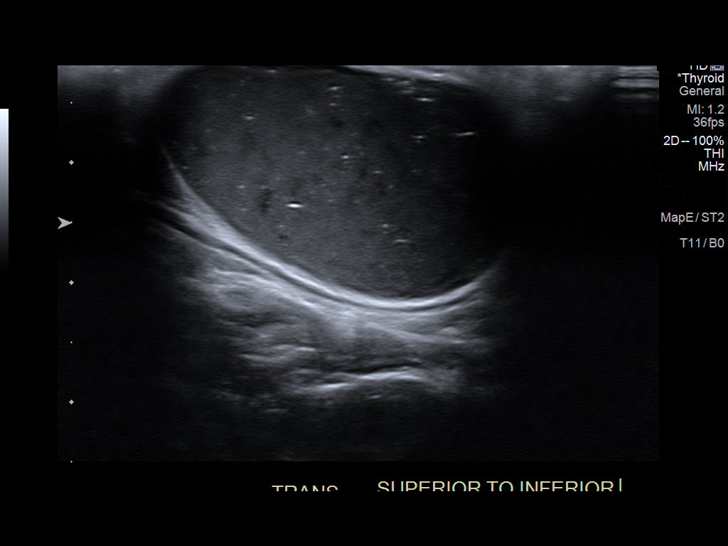
[im 5/10]
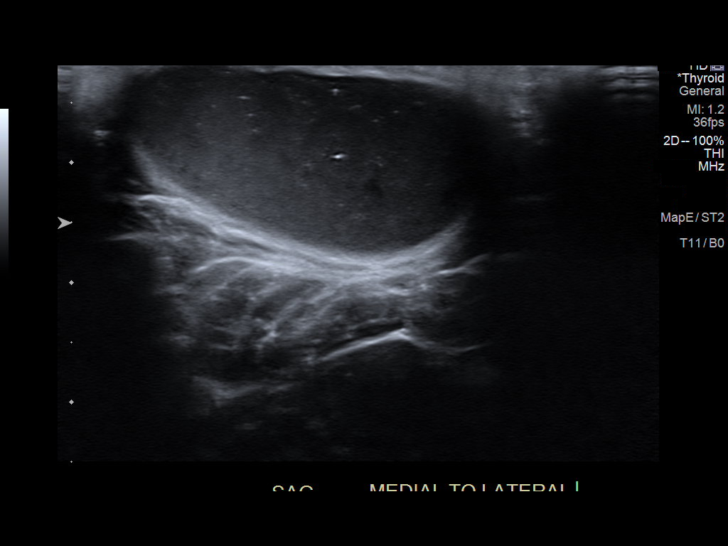
[im 6/10]
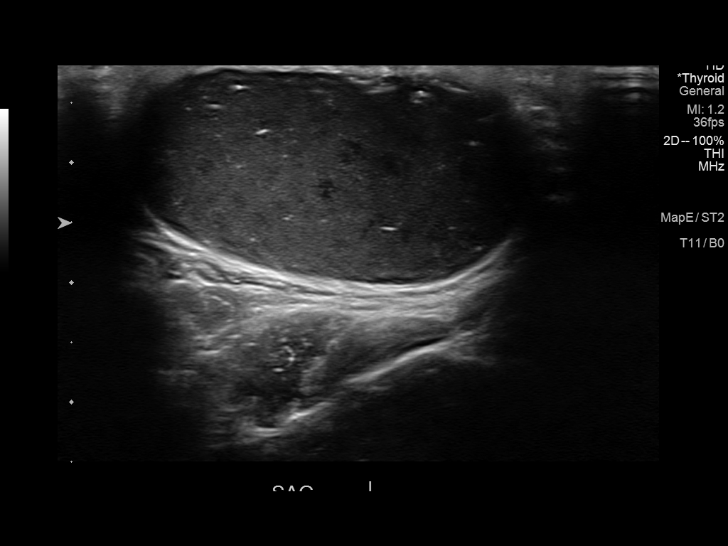
[im 7/10]
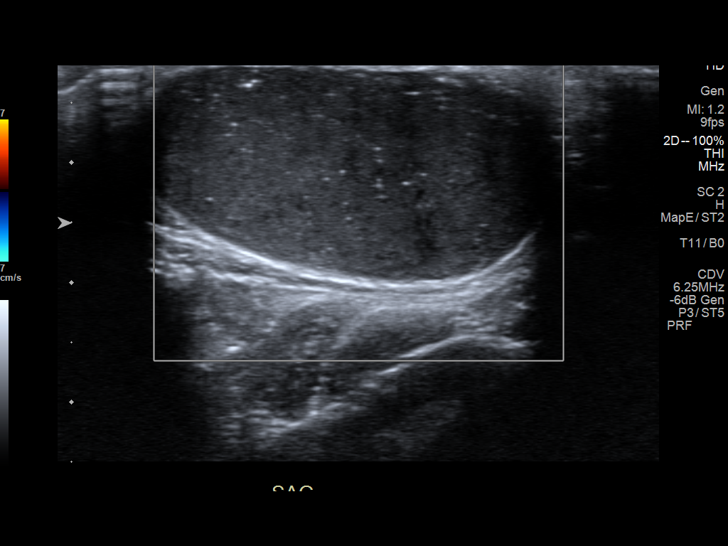
[im 8/10]
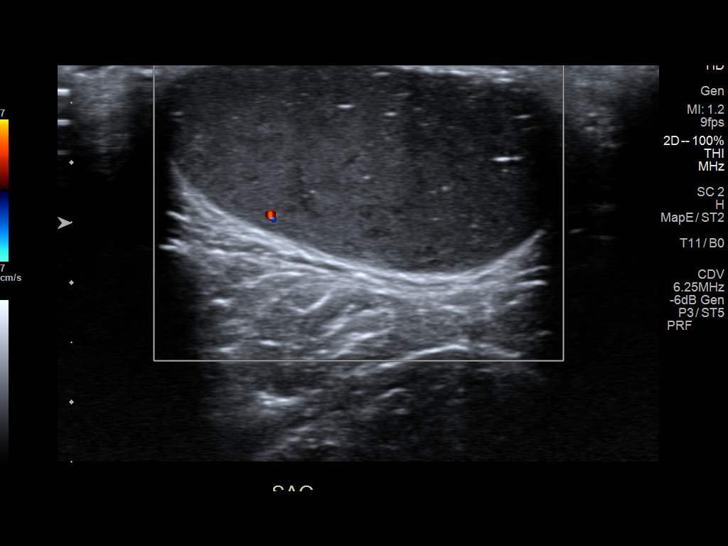
[im 9/10]
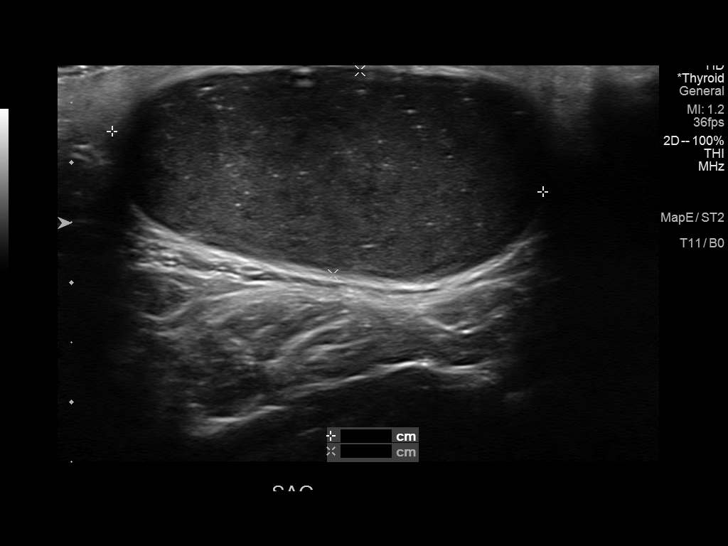
[im 10/10]
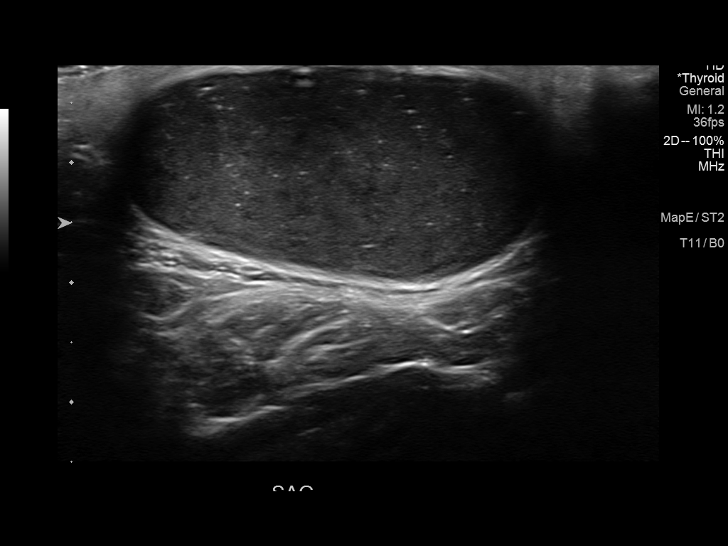

[10 of 10 positions shown; findings below may reference images not displayed]

FINDINGS: Sonographic interrogation of the region of clinical concern
demonstrates a circumscribed ovoid 3.5 x 1.8 x 2.9 cm hypoechoic
soft tissue mass. There are numerous punctate echogenic foci and
some linear echogenic reflectors scattered throughout the mass. No
significant vascularity on color Doppler evaluation. The mass is
positioned deep to the dermis in the superficial subcutaneous fat.
The imaging characteristics are not consistent with that of a
lipoma.
IMPRESSION: Approximately 3.5 cm circumscribed ovoid soft tissue mass with
internal microcalcifications. Given the absence of internal
vascularity, the imaging appearance favors that of a dermoid or
epidermoid cyst. Other less likely considerations include ectopic
thyroid nodule, or abnormal lymphadenopathy. The imaging appearance
is not classic for either of these abnormalities.

Recommend referral to ENT for further evaluation.
